# Patient Record
Sex: Male | Born: 1955 | Race: Black or African American | Hispanic: No | Marital: Married | Smoking: Never smoker
Health system: Southern US, Community
[De-identification: ages and names within clinical notes are randomized; demographics above are authoritative.]

## PROBLEM LIST (undated history)

## (undated) DIAGNOSIS — IMO0001 Reserved for inherently not codable concepts without codable children: Secondary | ICD-10-CM

## (undated) HISTORY — DX: Reserved for inherently not codable concepts without codable children: IMO0001

---

## 2007-08-18 ENCOUNTER — Emergency Department (HOSPITAL_COMMUNITY): Admission: EM | Admit: 2007-08-18 | Discharge: 2007-08-18 | Payer: Self-pay | Admitting: Emergency Medicine

## 2008-09-09 ENCOUNTER — Encounter: Admission: RE | Admit: 2008-09-09 | Discharge: 2008-09-09 | Payer: Self-pay | Admitting: Internal Medicine

## 2014-04-06 ENCOUNTER — Ambulatory Visit: Payer: Managed Care, Other (non HMO) | Admitting: Family Medicine

## 2014-04-06 ENCOUNTER — Ambulatory Visit: Payer: Managed Care, Other (non HMO)

## 2014-04-06 VITALS — BP 102/80 | HR 74 | Temp 97.9°F | Resp 16 | Ht 65.5 in | Wt 173.4 lb

## 2014-04-06 DIAGNOSIS — M719 Bursopathy, unspecified: Secondary | ICD-10-CM

## 2014-04-06 DIAGNOSIS — M25512 Pain in left shoulder: Secondary | ICD-10-CM

## 2014-04-06 DIAGNOSIS — M25519 Pain in unspecified shoulder: Secondary | ICD-10-CM

## 2014-04-06 DIAGNOSIS — M542 Cervicalgia: Secondary | ICD-10-CM

## 2014-04-06 DIAGNOSIS — M62838 Other muscle spasm: Secondary | ICD-10-CM

## 2014-04-06 DIAGNOSIS — M67912 Unspecified disorder of synovium and tendon, left shoulder: Secondary | ICD-10-CM

## 2014-04-06 DIAGNOSIS — M6790 Unspecified disorder of synovium and tendon, unspecified site: Secondary | ICD-10-CM

## 2014-04-06 DIAGNOSIS — M5030 Other cervical disc degeneration, unspecified cervical region: Secondary | ICD-10-CM

## 2014-04-06 MED ORDER — MELOXICAM 7.5 MG PO TABS: 7.5000 mg | ORAL_TABLET | Freq: Every day | ORAL | Status: DC

## 2014-04-06 MED ORDER — CYCLOBENZAPRINE HCL 5 MG PO TABS: ORAL_TABLET | ORAL | Status: DC

## 2014-04-06 NOTE — Progress Notes (Addendum)
Subjective:  This chart was scribed for Jonathan Flood, MD by Jonathan Santos, ED Scribe. This patient was seen in room 4 and the patient's care was started 11:50 AM.   Patient ID: Jonathan Santos, male    DOB: February 23, 1956, 58 y.o.   MRN: [id number]  HPI HPI Comments: Jonathan Santos is a 58 y.o. male who presents to [facility] complaining of 6 months of intermittent, unchanged left sided neck pain that radiates to the upper back and left shoulder. Patient states he also has some radiation to the left chest for the past 1 week but states this pain is superficial and he is able to reproduce it with applying pressure. He states the neck and upper back pain is worse with neck rotation. 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 He does not experience this pain while working but states the pain presents after he returns home and goes to sleep. He has associated mild left hand weakness at night when pain is most severe. He reports taking ibuprofen about once a week when the pain is presents or is most severe. He denies history of HTN, DM, HLD. He denies cardiac history. He denies nausea, vomiting, diaphoresis, palpitations, rashes, SOB.     Patient is from [place] and has been in the [place] for about 14 years. He states his kids are still in [place] and he travels to see them every 2 years.   PCP Sigmund Hazel   There are no active problems to display for this patient.  History reviewed. No pertinent past medical history. History reviewed. No pertinent past surgical history. No Known Allergies Prior to Admission medications   Not on File   History   Social History  . Marital Status: Married    Spouse Name: N/A    Number of Children: N/A  . Years of Education: N/A   Occupational History  . Not on file.   Social History Main Topics  . Smoking status: Never Smoker   . Smokeless tobacco: Not on file  . Alcohol Use: Yes     Comment: once in a  while  . Drug Use: No  . Sexual Activity: Yes   Other Topics Concern  . Not on file   Social History Narrative  . No narrative on file     Review of Systems  Constitutional: Negative for diaphoresis.  Respiratory: Negative for shortness of breath.   Cardiovascular: Positive for chest pain (superficial ). Negative for palpitations.  Gastrointestinal: Negative for nausea and vomiting.  Musculoskeletal: Positive for arthralgias (left shoulder), back pain (upper), myalgias and neck pain.  Skin: Negative for rash.  Neurological: Positive for weakness (left hand). Negative for numbness.       Objective:   Physical Exam  Vitals reviewed. Constitutional: He is oriented to person, place, and time. He appears well-developed and well-nourished.  HENT:  Head: Normocephalic and atraumatic.  Eyes: EOM are normal. Pupils are equal, round, and reactive to light.  Neck: No JVD present. Carotid bruit is not present.  Cervical: Extension lacks approximately 10 degrees. But intact flexion. Minimal decrease to left lateral flexion. Lacks approximately 15 degrees on right rotation. Lacks approximately 15 degrees on left rotation. Spasm over left paraspinal and left trapezius muscle.   Cardiovascular: Normal rate, regular rhythm and normal heart sounds.   No murmur heard. Pulmonary/Chest: Effort normal and breath sounds normal. He has no rales.  Musculoskeletal: He  exhibits no edema.  Left shoulder: Kinta and AC joints non-tender. Internal rotation slightly decreased on left shoulder to approximately L4. Otherwise intact ROM.   Full rotator cuff strength but slight discomfort with empty can and [place] testing.   NVI distally.   Neurological: He is alert and oriented to person, place, and time. He has normal strength.  Reflex Scores:      Tricep reflexes are 2+ on the right side and 2+ on the left side.      Bicep reflexes are 2+ on the right side and 2+ on the left side.      Brachioradialis  reflexes are 2+ on the right side and 2+ on the left side. Equal arm strength bilaterally. Equal grip strengths bilaterally. 2+ biceps, triceps, and brachioradialis reflexes.   Skin: Skin is warm and dry.  Psychiatric: He has a normal mood and affect.      Filed Vitals:   04/06/14 1124  BP: 102/80  Pulse: 74  Temp: 97.9 F (36.6 C)  TempSrc: Oral  Resp: 16  Height: 5' 5.5" (1.664 m)  Weight: 173 lb 6.4 oz (78.654 kg)  SpO2: 99%    [facility] reading (PRIMARY) by  Dr. Neva SeatGreene C-Spine: Diffuse degenerative changes mid to lower cervical spine with decreased space C4-5, greater than C5-6 with anterior spurring.   EKG: there are no previous tracings available for comparison. Sinus bradycardia. No apparent acute findings.      Assessment & Plan:   Jonathan SchwabDickson K Seliga is a 58 y.o. male Neck pain - Plan: DG Cervical Spine Complete, EKG 12-Lead, meloxicam (MOBIC) 7.5 MG tablet  Left shoulder pain - Plan: DG Cervical Spine Complete, EKG 12-Lead, meloxicam (MOBIC) 7.5 MG tablet  Muscle spasms of neck - Plan: cyclobenzaprine (FLEXERIL) 5 MG tablet  Tendinopathy of left rotator cuff  DDD (degenerative disc disease), cervical  Suspected DDD of cervical spine with possible radiculopathy into shoulder and anterior chest wall prior, but also some signs of RTC tendinosis. Able to work without difficulty.  Trial of Mobic, flexeril if needed. Sx care and if not improving in next 2 weeks, consider prednisone or MRi or PT. Sooner if worse.   ER/RTC precautions discussed if chest sx's return. Understanding expressed.   Meds ordered this encounter  Medications  . cyclobenzaprine (FLEXERIL) 5 MG tablet    Sig: 1 pill by mouth up to every 8 hours as needed. Start with one pill by mouth each bedtime as needed due to sedation    Dispense:  15 tablet    Refill:  0  . meloxicam (MOBIC) 7.5 MG tablet    Sig: Take 1 tablet (7.5 mg total) by mouth daily.    Dispense:  30 tablet    Refill:  0   Patient  Instructions  You do have arthritis in the neck, which can lead to some muscle spasm as well, or a pinched nerve into the shoulder and front of chest at times. You may also have tome tendon irritation in your rotator cuff (muscles of shoulder).  Try the mobic each morning (do not combine with other over the counter pain relievers), flexeril at night if needed.  Heat or ice to area as needed and the other treatments and exercises in the neck care manual as tolerated. If not improving in next few weeks, may need prednisone, a trial of physical therapy or other imaging such as an MRI. Return to the clinic or go to the nearest emergency room if any of your  symptoms worsen or new symptoms occur.  If chest pain returns, or especially if not associated with neck pain - be seen here or in the emergency room.   Rotator Cuff Tendinitis  Rotator cuff tendinitis is inflammation of the tough, cord-like bands that connect muscle to bone (tendons) in your rotator cuff. Your rotator cuff is the collection of all the muscles and tendons that connect your arm to your shoulder. Your rotator cuff holds the head of your upper arm bone (humerus) in the cup (fossa) of your shoulder blade (scapula). CAUSES Rotator cuff tendinitis is usually caused by overusing the joint involved.  SIGNS AND SYMPTOMS  Deep ache in the shoulder also felt on the outside upper arm over the shoulder muscle.  Point tenderness over the area that is injured.  Pain comes on gradually and becomes worse with lifting the arm to the side (abduction) or turning it inward (internal rotation).  May lead to a chronic tear: When a rotator cuff tendon becomes inflamed, it runs the risk of losing its blood supply, causing some tendon fibers to die. This increases the risk that the tendon can fray and partially or completely tear. DIAGNOSIS Rotator cuff tendinitis is diagnosed by taking a medical history, performing a physical exam, and reviewing results of  imaging exams. The medical history is useful to help determine the type of rotator cuff injury. The physical exam will include looking at the injured shoulder, feeling the injured area, and watching you do range-of-motion exercises. X-ray exams are typically done to rule out other causes of shoulder pain, such as fractures. MRI is the imaging exam usually used for significant shoulder injuries. Sometimes a dye study called CT arthrogram is done, but it is not as widely used as MRI. In some institutions, special ultrasound tests may also be used to aid in the diagnosis. TREATMENT  Less Severe Cases  Use of a sling to rest the shoulder for a short period of time. Prolonged use of the sling can cause stiffness, weakness, and loss of motion of the shoulder joint.  Anti-inflammatory medicines, such as ibuprofen or naproxen sodium, may be prescribed. More Severe Cases  Physical therapy.  Use of steroid injections into the shoulder joint.  Surgery. HOME CARE INSTRUCTIONS   Use a sling or splint until the pain decreases. Prolonged use of the sling can cause stiffness, weakness, and loss of motion of the shoulder joint.  Apply ice to the injured area:  Put ice in a plastic bag.  Place a towel between your skin and the bag.  Leave the ice on for 20 minutes, 2 3 times a day.  Try to avoid use other than gentle range of motion while your shoulder is painful. Use the shoulder and exercise only as directed by your health care provider. Stop exercises or range of motion if pain or discomfort increases, unless directed otherwise by your health care provider.  Only take over-the-counter or prescription medicines for pain, discomfort, or fever as directed by your health care provider.  If you were given a shoulder sling and straps (immobilizer), do not remove it except as directed, or until you see a health care provider for a follow-up exam. If you need to remove it, move your arm as little as possible  or as directed.  You may want to sleep on several pillows at night to lessen swelling and pain. SEEK IMMEDIATE MEDICAL CARE IF:   Your shoulder pain increases or new pain develops in your arm, hand,  or fingers and is not relieved with medicines.  You have new, unexplained symptoms, especially increased numbness in the hands or loss of strength.  You develop any worsening of the problems that brought you in for care.  Your arm, hand, or fingers are numb or tingling.  Your arm, hand, or fingers are swollen, painful, or turn white or blue. MAKE SURE YOU:  Understand these instructions.  Will watch your condition.  Will get help right away if you are not doing well or get worse. Document Released: 03/08/2004 Document Revised: 10/07/2013 Document Reviewed: 07/29/2013 [facility] Patient Information 2014 [place], [place].     I personally performed the services described in this documentation, which was scribed in my presence. The recorded information has been reviewed and considered, and addended by me as needed.

## 2014-04-06 NOTE — Patient Instructions (Addendum)
You do have arthritis in the neck, which can lead to some muscle spasm as well, or a pinched nerve into the shoulder and front of chest at times. You may also have tome tendon irritation in your rotator cuff (muscles of shoulder).  Try the mobic each morning (do not combine with other over the counter pain relievers), flexeril at night if needed.  Heat or ice to area as needed and the other treatments and exercises in the neck care manual as tolerated. If not improving in next few weeks, may need prednisone, a trial of physical therapy or other imaging such as an MRI. Return to the clinic or go to the nearest emergency room if any of your symptoms worsen or new symptoms occur.  If chest pain returns, or especially if not associated with neck pain - be seen here or in the emergency room.   Rotator Cuff Tendinitis  Rotator cuff tendinitis is inflammation of the tough, cord-like bands that connect muscle to bone (tendons) in your rotator cuff. Your rotator cuff is the collection of all the muscles and tendons that connect your arm to your shoulder. Your rotator cuff holds the head of your upper arm bone (humerus) in the cup (fossa) of your shoulder blade (scapula). CAUSES Rotator cuff tendinitis is usually caused by overusing the joint involved.  SIGNS AND SYMPTOMS  Deep ache in the shoulder also felt on the outside upper arm over the shoulder muscle.  Point tenderness over the area that is injured.  Pain comes on gradually and becomes worse with lifting the arm to the side (abduction) or turning it inward (internal rotation).  May lead to a chronic tear: When a rotator cuff tendon becomes inflamed, it runs the risk of losing its blood supply, causing some tendon fibers to die. This increases the risk that the tendon can fray and partially or completely tear. DIAGNOSIS Rotator cuff tendinitis is diagnosed by taking a medical history, performing a physical exam, and reviewing results of imaging exams.  The medical history is useful to help determine the type of rotator cuff injury. The physical exam will include looking at the injured shoulder, feeling the injured area, and watching you do range-of-motion exercises. X-ray exams are typically done to rule out other causes of shoulder pain, such as fractures. MRI is the imaging exam usually used for significant shoulder injuries. Sometimes a dye study called CT arthrogram is done, but it is not as widely used as MRI. In some institutions, special ultrasound tests may also be used to aid in the diagnosis. TREATMENT  Less Severe Cases  Use of a sling to rest the shoulder for a short period of time. Prolonged use of the sling can cause stiffness, weakness, and loss of motion of the shoulder joint.  Anti-inflammatory medicines, such as ibuprofen or naproxen sodium, may be prescribed. More Severe Cases  Physical therapy.  Use of steroid injections into the shoulder joint.  Surgery. HOME CARE INSTRUCTIONS   Use a sling or splint until the pain decreases. Prolonged use of the sling can cause stiffness, weakness, and loss of motion of the shoulder joint.  Apply ice to the injured area:  Put ice in a plastic bag.  Place a towel between your skin and the bag.  Leave the ice on for 20 minutes, 2 3 times a day.  Try to avoid use other than gentle range of motion while your shoulder is painful. Use the shoulder and exercise only as directed by your health  care provider. Stop exercises or range of motion if pain or discomfort increases, unless directed otherwise by your health care provider.  Only take over-the-counter or prescription medicines for pain, discomfort, or fever as directed by your health care provider.  If you were given a shoulder sling and straps (immobilizer), do not remove it except as directed, or until you see a health care provider for a follow-up exam. If you need to remove it, move your arm as little as possible or as  directed.  You may want to sleep on several pillows at night to lessen swelling and pain. SEEK IMMEDIATE MEDICAL CARE IF:   Your shoulder pain increases or new pain develops in your arm, hand, or fingers and is not relieved with medicines.  You have new, unexplained symptoms, especially increased numbness in the hands or loss of strength.  You develop any worsening of the problems that brought you in for care.  Your arm, hand, or fingers are numb or tingling.  Your arm, hand, or fingers are swollen, painful, or turn white or blue. MAKE SURE YOU:  Understand these instructions.  Will watch your condition.  Will get help right away if you are not doing well or get worse. Document Released: 03/08/2004 Document Revised: 10/07/2013 Document Reviewed: 07/29/2013 [facility] Patient Information 2014 [place], [place].

## 2015-05-24 ENCOUNTER — Other Ambulatory Visit: Payer: Self-pay | Admitting: Family Medicine

## 2015-05-24 DIAGNOSIS — R51 Headache: Principal | ICD-10-CM

## 2015-05-24 DIAGNOSIS — R519 Headache, unspecified: Secondary | ICD-10-CM

## 2015-06-04 ENCOUNTER — Ambulatory Visit
Admission: RE | Admit: 2015-06-04 | Discharge: 2015-06-04 | Disposition: A | Discharge status: Home / Self Care | Payer: Managed Care, Other (non HMO) | Source: Ambulatory Visit | Attending: Family Medicine | Admitting: Family Medicine

## 2015-06-04 DIAGNOSIS — R519 Headache, unspecified: Secondary | ICD-10-CM

## 2015-06-04 DIAGNOSIS — R51 Headache: Principal | ICD-10-CM

## 2015-06-04 MED ORDER — GADOBENATE DIMEGLUMINE 529 MG/ML IV SOLN: 15.0000 mL | Freq: Once | INTRAVENOUS | Status: AC | PRN

## 2015-06-07 ENCOUNTER — Ambulatory Visit (INDEPENDENT_AMBULATORY_CARE_PROVIDER_SITE_OTHER): Payer: Managed Care, Other (non HMO) | Admitting: Diagnostic Neuroimaging

## 2015-06-07 ENCOUNTER — Encounter: Payer: Self-pay | Admitting: Diagnostic Neuroimaging

## 2015-06-07 VITALS — BP 119/72 | HR 64 | Ht 65.5 in | Wt 167.0 lb

## 2015-06-07 DIAGNOSIS — G44209 Tension-type headache, unspecified, not intractable: Secondary | ICD-10-CM | POA: Diagnosis not present

## 2015-06-07 NOTE — Progress Notes (Signed)
[facility]  PATIENT: Jonathan Santos DOB: 11-Mar-1956  REFERRING CLINICIAN: Antonietta Barcelona, PA HISTORY FROM: patient  REASON FOR VISIT: new consult    HISTORICAL  CHIEF COMPLAINT:  Chief Complaint  Patient presents with  . New Evaluation    chronic headaches; occuring for almost 2 months, wakes him up in the night with ringing in the ears     HISTORY OF PRESENT ILLNESS:   59 year old right-handed male here for evaluation of headaches. For past 2 months patient has had intermittent headaches, mainly in the back of the head, sometimes rating to the front, sometimes associated with an echo sound which wakes him up around 1 or 2 in the morning. At that time headache and be 10 out of 10 in severity. No nausea or vomiting. No photophobia or phonophobia. Currently patient has 4 out of 10 severity headache.  February 2016 patient went from 8 hour work days up to 12 hour work days, also with change in sleep schedule. Now he has to wake up at 5:00 in the morning. Previously was working second shift. Patient thinks that increased work schedule and stress have caused these headaches. Patient is planning to go back to 8 hour work days in July 2016.  No other numbness, tingling, slurred speech or trouble talking. Patient's daughter had headaches when she was younger.  REVIEW OF SYSTEMS: Full 14 system review of systems performed and notable only for chest pain ringing in ears joint pain headache disinterest in activities.  ALLERGIES: No Known Allergies  HOME MEDICATIONS: Outpatient Prescriptions Prior to Visit  Medication Sig Dispense Refill  . cyclobenzaprine (FLEXERIL) 5 MG tablet 1 pill by mouth up to every 8 hours as needed. Start with one pill by mouth each bedtime as needed due to sedation 15 tablet 0  . meloxicam (MOBIC) 7.5 MG tablet Take 1 tablet (7.5 mg total) by mouth daily. 30 tablet 0   No facility-administered medications prior to visit.    PAST MEDICAL  HISTORY: Past Medical History  Diagnosis Date  . Healthy adult     PAST SURGICAL HISTORY: No past surgical history on file.  FAMILY HISTORY: No family history on file.  SOCIAL HISTORY:  History   Social History  . Marital Status: Married    Spouse Name: Jasmine December  . Number of Children: 4  . Years of Education: 12   Occupational History  . Not on file.   Social History Main Topics  . Smoking status: Never Smoker   . Smokeless tobacco: Not on file  . Alcohol Use: Yes     Comment: once in a while  . Drug Use: No  . Sexual Activity: Yes   Other Topics Concern  . Not on file   Social History Narrative   Lives at home with wife   Does not drink caffeine     PHYSICAL EXAM  Filed Vitals:   06/07/15 0843  BP: 119/72  Pulse: 64  Height: 5' 5.5" (1.664 m)  Weight: 167 lb (75.751 kg)    Body mass index is 27.36 kg/(m^2).   Visual Acuity Screening   Right eye Left eye Both eyes  Without correction: 20/40 20/50   With correction:       No flowsheet data found.  GENERAL EXAM: Patient is in no distress; well developed, nourished and groomed; neck is supple; SCLERAL INJECTION  CARDIOVASCULAR: Regular rate and rhythm, no murmurs, no carotid bruits  NEUROLOGIC: MENTAL STATUS: awake, alert, oriented to person, place and  time, recent and remote memory intact, normal attention and concentration, language fluent, comprehension intact, naming intact, fund of knowledge appropriate CRANIAL NERVE: no papilledema on fundoscopic exam, pupils equal and reactive to light, visual fields full to confrontation, extraocular muscles intact, no nystagmus, facial sensation and strength symmetric, hearing intact, palate elevates symmetrically, uvula midline, shoulder shrug symmetric, tongue midline. MOTOR: normal bulk and tone, full strength in the BUE, BLE SENSORY: normal and symmetric to light touch, pinprick, temperature, vibration  COORDINATION: finger-nose-finger, fine finger  movements normal REFLEXES: deep tendon reflexes present and symmetric GAIT/STATION: narrow based gait; able to walk tandem; romberg is negative    DIAGNOSTIC DATA (LABS, IMAGING, TESTING) - I reviewed patient records, labs, notes, testing and imaging myself where available.  No results found for: WBC, HGB, HCT, MCV, PLT No results found for: NA, K, CL, CO2, GLUCOSE, BUN, CREATININE, CALCIUM, PROT, ALBUMIN, AST, ALT, ALKPHOS, BILITOT, GFRNONAA, GFRAA No results found for: CHOL, HDL, LDLCALC, LDLDIRECT, TRIG, CHOLHDL No results found for: PIRJ1OHGBA1C No results found for: VITAMINB12 No results found for: TSH   06/04/15 MRI BRAIN - Nonspecific left frontal calvarial 5 mm enhancing structure. Otherwise no intracranial mass or abnormal enhancement is noted. No acute infarct, intracranial hemorrhage or hydrocephalus. [I reviewed images myself and agree with interpretation. -VRP]     ASSESSMENT AND PLAN  59 y.o. year old male here with NEW ONSET HEADACHES since April 2016, in setting of changed work schedule (8hr --> 12 hrs per day; earlier shift and wake up time; less time off). Neuro exam, MRI brain, labs unremarkable. Likely represents tension headache due to change in sleep/work schedule.  PLAN: - monitor symptoms; pt plans to go back to 8 hr days in July 2016 - tylenol, ibuprofen prn - rest, hydration, nutrition reviewed - if HA continue or worsen, may consider add'l testing at next visit (such as LP, labs, CTA head)  Return in about 6 weeks (around 07/19/2015).    Suanne MarkerVIKRAM R. Samaiya Awadallah, MD 06/07/2015, 9:40 AM Certified in Neurology, Neurophysiology and Neuroimaging  [facility] [address], [address] [place], [place] [address] [phone]

## 2015-06-07 NOTE — Patient Instructions (Signed)
Monitor symptoms. 

## 2015-06-08 ENCOUNTER — Encounter: Payer: Self-pay | Admitting: Diagnostic Neuroimaging

## 2015-07-20 ENCOUNTER — Encounter: Payer: Self-pay | Admitting: Diagnostic Neuroimaging

## 2015-07-20 ENCOUNTER — Ambulatory Visit (INDEPENDENT_AMBULATORY_CARE_PROVIDER_SITE_OTHER): Payer: Managed Care, Other (non HMO) | Admitting: Diagnostic Neuroimaging

## 2015-07-20 VITALS — BP 108/68 | HR 70 | Ht 65.5 in | Wt 168.8 lb

## 2015-07-20 DIAGNOSIS — R0683 Snoring: Secondary | ICD-10-CM

## 2015-07-20 DIAGNOSIS — G4719 Other hypersomnia: Secondary | ICD-10-CM | POA: Diagnosis not present

## 2015-07-20 DIAGNOSIS — G44209 Tension-type headache, unspecified, not intractable: Secondary | ICD-10-CM

## 2015-07-20 NOTE — Progress Notes (Signed)
[facility]  PATIENT: Jonathan Santos DOB: 1956-11-07  REFERRING CLINICIAN: Antonietta Barcelona, PA HISTORY FROM: patient  REASON FOR VISIT: follow up   HISTORICAL  CHIEF COMPLAINT:  Chief Complaint  Patient presents with  . Tension HA    rm 6, echo sound in my head"  . Follow-up    HISTORY OF PRESENT ILLNESS:   UPDATE 07/20/15: Since last visit, HA are slightly better. Still having early morning "echo" sounds in both ears. Reports history of snoring. Also has tendency to easily fall asleep in the daytime and nap.  PRIOR HPI (06/07/15): 59 year old right-handed male here for evaluation of headaches. For past 2 months patient has had intermittent headaches, mainly in the back of the head, sometimes rating to the front, sometimes associated with an echo sound which wakes him up around 1 or 2 in the morning. At that time headache and be 10 out of 10 in severity. No nausea or vomiting. No photophobia or phonophobia. Currently patient has 4 out of 10 severity headache. February 2016 patient went from 8 hour work days up to 12 hour work days, also with change in sleep schedule. Now he has to wake up at 5:00 in the morning. Previously was working second shift. Patient thinks that increased work schedule and stress have caused these headaches. Patient is planning to go back to 8 hour work days in July 2016. No other numbness, tingling, slurred speech or trouble talking. Patient's daughter had headaches when she was younger.   REVIEW OF SYSTEMS: Full 14 system review of systems performed and notable only for as per HPI.   ALLERGIES: No Known Allergies  HOME MEDICATIONS: Outpatient Prescriptions Prior to Visit  Medication Sig Dispense Refill  . acetaminophen (TYLENOL) 325 MG tablet Take 650 mg by mouth every 6 (six) hours as needed.    Marland Kitchen ibuprofen (ADVIL,MOTRIN) 200 MG tablet Take 200 mg by mouth every 6 (six) hours as needed.     No facility-administered medications prior to  visit.    PAST MEDICAL HISTORY: Past Medical History  Diagnosis Date  . Healthy adult     PAST SURGICAL HISTORY: No past surgical history on file.  FAMILY HISTORY: No family history on file.  SOCIAL HISTORY:  History   Social History  . Marital Status: Married    Spouse Name: Jasmine December  . Number of Children: 4  . Years of Education: 12   Occupational History  . Not on file.   Social History Main Topics  . Smoking status: Never Smoker   . Smokeless tobacco: Not on file  . Alcohol Use: Yes     Comment: once in a while  . Drug Use: No  . Sexual Activity: Yes   Other Topics Concern  . Not on file   Social History Narrative   Lives at home with wife   Does not drink caffeine     PHYSICAL EXAM  Filed Vitals:   07/20/15 1054  BP: 108/68  Pulse: 70  Height: 5' 5.5" (1.664 m)  Weight: 168 lb 12.8 oz (76.567 kg)    Body mass index is 27.65 kg/(m^2).  No exam data present  No flowsheet data found.  GENERAL EXAM: Patient is in no distress; well developed, nourished and groomed; neck is supple; SCLERAL INJECTION  CARDIOVASCULAR: Regular rate and rhythm, no murmurs, no carotid bruits  NEUROLOGIC: MENTAL STATUS: awake, alert, language fluent, comprehension intact, naming intact, fund of knowledge appropriate CRANIAL NERVE: pupils equal and reactive to light,  visual fields full to confrontation, extraocular muscles intact, no nystagmus, facial sensation and strength symmetric, hearing intact, palate elevates symmetrically, uvula midline, shoulder shrug symmetric, tongue midline. MOTOR: normal bulk and tone, full strength in the BUE, BLE SENSORY: normal and symmetric to light touch, temperature, vibration  COORDINATION: finger-nose-finger, fine finger movements normal REFLEXES: deep tendon reflexes present and symmetric GAIT/STATION: narrow based gait; able to walk tandem; romberg is negative    DIAGNOSTIC DATA (LABS, IMAGING, TESTING) - I reviewed patient  records, labs, notes, testing and imaging myself where available.  No results found for: WBC, HGB, HCT, MCV, PLT No results found for: NA, K, CL, CO2, GLUCOSE, BUN, CREATININE, CALCIUM, PROT, ALBUMIN, AST, ALT, ALKPHOS, BILITOT, GFRNONAA, GFRAA No results found for: CHOL, HDL, LDLCALC, LDLDIRECT, TRIG, CHOLHDL No results found for: BJYN8GHGBA1C No results found for: VITAMINB12 No results found for: TSH   06/04/15 MRI BRAIN - Nonspecific left frontal calvarial 5 mm enhancing structure. Otherwise no intracranial mass or abnormal enhancement is noted. No acute infarct, intracranial hemorrhage or hydrocephalus. [I reviewed images myself and agree with interpretation. -VRP]     ASSESSMENT AND PLAN  59 y.o. year old male here with NEW ONSET HEADACHES since April 2016, in setting of changed work schedule (8hr --> 12 hrs per day; earlier shift and wake up time; less time off). Neuro exam, MRI brain, labs unremarkable. Likely represents tension headache due to change in sleep/work schedule. HA slightly better, and now back to 8 hour work days. Also has snoring and excessive daytime sleepiness. Will check sleep study to rule out sleep apnea.  PLAN: I spent 15 minutes of face to face time with patient. Greater than 50% of time was spent in counseling and coordination of care with patient. In summary we discussed:  - check sleep study (HA, daytime sleepiness, snoring) - monitor symptoms - tylenol, ibuprofen prn - rest, hydration, nutrition reviewed - if HA continue or worsen, may consider add'l testing at next visit (such as LP, labs, CTA head)  Return in about 6 months (around 01/20/2016).    Suanne MarkerVIKRAM R. Lc Joynt, MD 07/20/2015, 11:22 AM Certified in Neurology, Neurophysiology and Neuroimaging  [facility] [address], [address] [place], [place] [id number] [phone]

## 2015-08-29 ENCOUNTER — Telehealth: Payer: Self-pay

## 2015-08-29 ENCOUNTER — Institutional Professional Consult (permissible substitution): Payer: Managed Care, Other (non HMO) | Admitting: Neurology

## 2015-08-29 NOTE — Telephone Encounter (Signed)
Patient did not show to appt.  

## 2015-08-31 ENCOUNTER — Encounter: Payer: Self-pay | Admitting: Neurology

## 2015-10-05 ENCOUNTER — Ambulatory Visit (INDEPENDENT_AMBULATORY_CARE_PROVIDER_SITE_OTHER): Payer: Managed Care, Other (non HMO) | Admitting: Neurology

## 2015-10-05 ENCOUNTER — Encounter: Payer: Self-pay | Admitting: Neurology

## 2015-10-05 VITALS — BP 108/68 | HR 62 | Resp 16 | Ht 65.5 in | Wt 170.0 lb

## 2015-10-05 DIAGNOSIS — R0683 Snoring: Secondary | ICD-10-CM

## 2015-10-05 DIAGNOSIS — G4719 Other hypersomnia: Secondary | ICD-10-CM

## 2015-10-05 DIAGNOSIS — R51 Headache: Secondary | ICD-10-CM | POA: Diagnosis not present

## 2015-10-05 DIAGNOSIS — R519 Headache, unspecified: Secondary | ICD-10-CM

## 2015-10-05 DIAGNOSIS — G478 Other sleep disorders: Secondary | ICD-10-CM

## 2015-10-05 DIAGNOSIS — R351 Nocturia: Secondary | ICD-10-CM

## 2015-10-05 NOTE — Patient Instructions (Addendum)
Based on your symptoms and your exam I believe you may be at risk for obstructive sleep apnea or OSA, and I think we should proceed with a sleep study to determine whether you do or do not have OSA and how severe it is. If you have more than mild OSA, I want you to consider treatment with CPAP. Please remember, the risks and ramifications of moderate to severe obstructive sleep apnea or OSA are: Cardiovascular disease, including congestive heart failure, stroke, difficult to control hypertension, arrhythmias, and even type 2 diabetes has been linked to untreated OSA. Sleep apnea causes disruption of sleep and sleep deprivation in most cases, which, in turn, can cause recurrent headaches, problems with memory, mood, concentration, focus, and vigilance. Most people with untreated sleep apnea report excessive daytime sleepiness, which can affect their ability to drive. Please do not drive if you feel sleepy.   I will likely see you back after your sleep study to go over the test results and where to go from there. We will call you after your sleep study to advise about the results (most likely, you will hear from Lafonda Mosses, my nurse) and to set up an appointment at the time, as necessary.    Our sleep lab administrative assistant, Alvis Lemmings will meet with you or call you to schedule your sleep study. If you don't hear back from her by next week please feel free to call her at [phone]. This is her direct line and please leave a message with your phone number to call back if you get the voicemail box. She will call back as soon as possible.   Due to your work schedule, we will try to find a Saturday night for your sleep study.

## 2015-10-05 NOTE — Progress Notes (Signed)
Subjective:    Patient ID: Jonathan Santos is a 59 y.o. male.  HPI     Huston Foley, MD, PhD [facility] [address], [address] [address] [place], [place] [address]  Dear Satira Sark,    I saw your patient, Jonathan Santos, upon your kind request in my clinic today for initial consultation of his sleep disorder, in particular, concern for underlying obstructive sleep apnea. The patient is unaccompanied today. Of note, the patient no showed for an appointment on 08/29/2015. As you know, Jonathan Santos is a 59 year old right-handed gentleman with an underlying medical history of recurrent tension-type headaches, and overweight state, who reports snoring and excessive daytime somnolence. He feels tired during the day. He does not wake up rested typically. His wife is not noticed any breathing pauses while he is asleep. He lives with his wife. He has 3 grown children, all in [place]. He has a variable bedtime because of his work schedule. 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 He has never fallen asleep at work or while driving.  His work schedule is 3 PM to 11:30 PM , and on Mondays and Wednesdays he works from 3 PM to 3 AM. Bedtime is usually around 12:30 AM and rise time is 9 AM, when he comes home on early Tuesdays and Thursdays he goes to bed around 4:30 AM. He sometimes wakes up with a headache and a pulsating echo like sound in his head. He has taken Advil as needed. He takes it about once a week. He drinks no significant caffeine and very rare all call. She does not smoke. He tries to drink a lot of water. He does have nocturia about 2-3 times on an average night because he drinks a lot of water right before bedtime. He has not noted any edema. He denies any restless leg symptoms or leg twitching at night but is a restless sleeper and likes to hug a pillow while sleeping.  I reviewed your office note from 07/20/2015.  His Past Medical History  Is Significant For: Past Medical History  Diagnosis Date  . Healthy adult     His Past Surgical History Is Significant For: No past surgical history on file.  His Family History Is Significant For: No family history on file.  Her Social History Is Significant For: Social History   Social History  . Marital Status: Married    Spouse Name: Jasmine December  . Number of Children: 3  . Years of Education: 15   Social History Main Topics  . Smoking status: Never Smoker   . Smokeless tobacco: None  . Alcohol Use: Yes     Comment: once in a while  . Drug Use: No  . Sexual Activity: Yes   Other Topics Concern  . None   Social History Narrative   Lives at home with wife   Does not drink caffeine    His Allergies Are:  No Known Allergies:   His Current Medications Are:  Outpatient Encounter Prescriptions as of 10/05/2015  Medication Sig  . ibuprofen (ADVIL,MOTRIN) 200 MG tablet Take 200 mg by mouth every 6 (six) hours as needed.  Marland Kitchen acetaminophen (TYLENOL) 325 MG tablet Take 650 mg by mouth every 6 (six) hours as needed.   No facility-administered encounter medications on file as of 10/05/2015.  :  Review of Systems:  Out of a complete 14 point review of systems, all are reviewed and negative with the exception of  these symptoms as listed below:  Review of Systems  Neurological: Positive for headaches.       Patient wakes up about 3-4 am, hearing a "noise" in the back of his head accompanied with a headache. He states that wife reports some snoring.     Objective:  Neurologic Exam  Physical Exam Physical Examination:   Filed Vitals:   10/05/15 0924  BP: 108/68  Pulse: 62  Resp: 16    General Examination: The patient is a very pleasant 59 y.o. male in no acute distress. He appears well-developed and well-nourished and adequately groomed. He is not obese.   HEENT: Normocephalic, atraumatic, pupils are equal, round and reactive to light and accommodation. Funduscopic exam  is normal with sharp disc margins noted. Extraocular tracking is good without limitation to gaze excursion or nystagmus noted. Normal smooth pursuit is noted. Hearing is grossly intact. Tympanic membranes are clear bilaterally. Face is symmetric with normal facial animation and normal facial sensation. Speech is clear with no dysarthria noted. There is no hypophonia. There is no lip, neck/head, jaw or voice tremor. Neck is supple with full range of passive and active motion. There are no carotid bruits on auscultation. Oropharynx exam reveals: mild mouth dryness, good dental hygiene and moderate airway crowding, due to narrow airway entry, longer tongue, tonsils in place and larger uvula, which ends in a wisp. Mallampati is class II. Tongue protrudes centrally and palate elevates symmetrically. Tonsils are 1+ in size, right more prominent. Neck size is 15 inches. He has a Mild overbite. Nasal inspection reveals no significant nasal mucosal bogginess or redness and no septal deviation.   Chest: Clear to auscultation without wheezing, rhonchi or crackles noted.  Heart: S1+S2+0, regular and normal without murmurs, rubs or gallops noted.   Abdomen: Soft, non-tender and non-distended with normal bowel sounds appreciated on auscultation.  Extremities: There is no pitting edema in the distal lower extremities bilaterally. Pedal pulses are intact.  Skin: Warm and dry without trophic changes noted. There are no varicose veins.  Musculoskeletal: exam reveals no obvious joint deformities, tenderness or joint swelling or erythema.   Neurologically:  Mental status: The patient is awake, alert and oriented in all 4 spheres. His immediate and remote memory, attention, language skills and fund of knowledge are appropriate. There is no evidence of aphasia, agnosia, apraxia or anomia. Speech is clear with normal prosody and enunciation. Thought process is linear. Mood is normal and affect is normal.  Cranial nerves II  - XII are as described above under HEENT exam. In addition: shoulder shrug is normal with equal shoulder height noted. Motor exam: Normal bulk, strength and tone is noted. There is no drift, tremor or rebound. Romberg is negative. Reflexes are 2+ throughout. Babinski: Toes are flexor bilaterally. Fine motor skills and coordination: intact with normal finger taps, normal hand movements, normal rapid alternating patting, normal foot taps and normal foot agility.  Cerebellar testing: No dysmetria or intention tremor on finger to nose testing. Heel to shin is unremarkable bilaterally. There is no truncal or gait ataxia.  Sensory exam: intact to light touch in the upper and lower extremities.  Gait, station and balance: He stands easily. No veering to one side is noted. No leaning to one side is noted. Posture is age-appropriate and stance is narrow based. Gait shows normal stride length and normal pace. No problems turning are noted. He turns en bloc. Tandem walk is unremarkable.    Assessment and Plan:   In summary,  Jonathan Santos is a very pleasant 59 y.o.-year old male with a history and physical exam concerning for obstructive sleep apnea (OSA). I had a long chat with the patient about my findings and the diagnosis of OSA, its prognosis and treatment options. We talked about medical treatments, surgical interventions and non-pharmacological approaches. I explained in particular the risks and ramifications of untreated moderate to severe OSA, especially with respect to developing cardiovascular disease down the Road, including congestive heart failure, difficult to treat hypertension, cardiac arrhythmias, or stroke. Even type 2 diabetes has, in part, been linked to untreated OSA. Symptoms of untreated OSA include daytime sleepiness, memory problems, mood irritability and mood disorder such as depression and anxiety, lack of energy, as well as recurrent headaches, especially morning headaches. We talked  about ongoing healthy lifestyle in general, as well as the importance of weight control. I encouraged the patient to eat healthy, exercise daily and keep well hydrated, to keep a scheduled bedtime and wake time routine, to not skip any meals and eat healthy snacks in between meals. I advised the patient not to drive when feeling sleepy. I recommended the following at this time: sleep study with potential positive airway pressure titration. (We will score hypopneas at 3% and split the sleep study into diagnostic and treatment portion, if the estimated. 2 hour AHI is >15/h, or as per insurance mandate).   I explained the sleep test procedure to the patient and also outlined possible surgical and non-surgical treatment options of OSA, including the use of a custom-made dental device (which would require a referral to a specialist dentist or oral surgeon), upper airway surgical options, such as pillar implants, radiofrequency surgery, tongue base surgery, and UPPP (which would involve a referral to an ENT surgeon). Rarely, jaw surgery such as mandibular advancement may be considered.  I also explained the CPAP treatment option to the patient, who indicated that he would be willing to try CPAP if the need arises. I explained the importance of being compliant with PAP treatment, not only for insurance purposes but primarily to improve His symptoms, and for the patient's long term health benefit, including to reduce His cardiovascular risks. I answered all his questions today and the patient was in agreement. I would like to see him back after the sleep study is completed and asked him to keep his appointment with you for ongoing headache management.  Thank you very much for allowing me to participate in the care of this nice patient. If I can be of any further assistance to you please do not hesitate to talk to me.  Sincerely,   Huston Foley, MD, PhD

## 2015-11-11 ENCOUNTER — Ambulatory Visit (INDEPENDENT_AMBULATORY_CARE_PROVIDER_SITE_OTHER): Payer: Managed Care, Other (non HMO) | Admitting: Neurology

## 2015-11-11 DIAGNOSIS — G4720 Circadian rhythm sleep disorder, unspecified type: Secondary | ICD-10-CM

## 2015-11-11 DIAGNOSIS — G4733 Obstructive sleep apnea (adult) (pediatric): Secondary | ICD-10-CM

## 2015-11-12 NOTE — Sleep Study (Signed)
Please see the scanned sleep study interpretation located in the procedure tab within the chart review section.   

## 2015-11-16 ENCOUNTER — Telehealth: Payer: Self-pay | Admitting: Neurology

## 2015-11-16 ENCOUNTER — Telehealth: Payer: Self-pay

## 2015-11-16 NOTE — Telephone Encounter (Signed)
Encounter should be fixed on this patient

## 2015-11-16 NOTE — Telephone Encounter (Signed)
Pt returned Jonathan Santos's call. He is leaving for work and will call back when he gets to work.

## 2015-11-16 NOTE — Telephone Encounter (Signed)
Patient called back but I missed call. I called him back and got vm. I left results and recommendations on vm, left call back number for further questions.

## 2015-11-16 NOTE — Telephone Encounter (Signed)
Left message for patient to call back  

## 2015-11-16 NOTE — Telephone Encounter (Signed)
Patient referred by Dr. Marjory LiesPenumalli, seen by me on 10/05/15, diagnostic PSG on 11/11/15.   Please call and notify the patient that the recent sleep study did not show any significant obstructive sleep apnea. He had mild sleep apnea only when in dream sleep and when sleeping on his back. For this, weight loss and avoidance of the supine sleep position are recommended, treatment with CPAP is not warranted. He can FU with Dr. Marjory LiesPenumalli as planned. Also, route or fax report to PCP and referring MD, if other than PCP.  Once you have spoken to patient, you can close this encounter.   Thanks,  Huston FoleySaima Jonisha Kindig, MD, PhD [facility]

## 2015-12-16 ENCOUNTER — Other Ambulatory Visit: Payer: Self-pay | Admitting: Occupational Medicine

## 2015-12-16 ENCOUNTER — Ambulatory Visit: Payer: Self-pay

## 2015-12-16 DIAGNOSIS — Z0000 Encounter for general adult medical examination without abnormal findings: Secondary | ICD-10-CM

## 2016-01-23 ENCOUNTER — Ambulatory Visit (INDEPENDENT_AMBULATORY_CARE_PROVIDER_SITE_OTHER): Payer: Managed Care, Other (non HMO) | Admitting: Diagnostic Neuroimaging

## 2016-01-23 ENCOUNTER — Encounter: Payer: Self-pay | Admitting: Diagnostic Neuroimaging

## 2016-01-23 VITALS — BP 103/68 | HR 66 | Ht 65.5 in | Wt 167.6 lb

## 2016-01-23 DIAGNOSIS — G44209 Tension-type headache, unspecified, not intractable: Secondary | ICD-10-CM

## 2016-01-23 NOTE — Progress Notes (Signed)
[facility]  PATIENT: Jonathan Santos DOB: [birth year]  REFERRING CLINICIAN: Antonietta Barcelona, PA HISTORY FROM: patient  REASON FOR VISIT: follow up   HISTORICAL  CHIEF COMPLAINT:  Chief Complaint  Patient presents with  . Tension Headache    rm 7, HA not changed dramatically, off and on around 3 am, freq varies a lot, I drink a lot of water-lie back down and HA goes awayringing in my, ears continues"  . Follow-up    6 month    HISTORY OF PRESENT ILLNESS:   UPDATE 01/23/16: Since last visit, HA are stable. Now working 3p-11p, 6 days per week. He feels that his work schedule is quite rigorous, and that he needs more rest.   UPDATE 07/20/15: Since last visit, HA are slightly better. Still having early morning "echo" sounds in both ears. Reports history of snoring. Also has tendency to easily fall asleep in the daytime and nap.  PRIOR HPI (06/07/15): 60 year old right-handed male here for evaluation of headaches. For past 2 months patient has had intermittent headaches, mainly in the back of the head, sometimes rating to the front, sometimes associated with an echo sound which wakes him up around 1 or 2 in the morning. At that time headache and be 10 out of 10 in severity. No nausea or vomiting. No photophobia or phonophobia. Currently patient has 4 out of 10 severity headache. February 2016 patient went from 8 hour work days up to 12 hour work days, also with change in sleep schedule. Now he has to wake up at 5:00 in the morning. Previously was working second shift. Patient thinks that increased work schedule and stress have caused these headaches. Patient is planning to go back to 8 hour work days in July 2016. No other numbness, tingling, slurred speech or trouble talking. Patient's daughter had headaches when she was younger.   REVIEW OF SYSTEMS: Full 14 system review of systems performed and notable only for as per HPI.   ALLERGIES: No Known Allergies  HOME  MEDICATIONS: Outpatient Prescriptions Prior to Visit  Medication Sig Dispense Refill  . acetaminophen (TYLENOL) 325 MG tablet Take 650 mg by mouth every 6 (six) hours as needed.    Marland Kitchen ibuprofen (ADVIL,MOTRIN) 200 MG tablet Take 200 mg by mouth every 6 (six) hours as needed.     No facility-administered medications prior to visit.    PAST MEDICAL HISTORY: Past Medical History  Diagnosis Date  . Healthy adult     PAST SURGICAL HISTORY: History reviewed. No pertinent past surgical history.  FAMILY HISTORY: History reviewed. No pertinent family history.  SOCIAL HISTORY:  Social History   Social History  . Marital Status: Married    Spouse Name: Jasmine December  . Number of Children: 3  . Years of Education: 12   Occupational History  . Not on file.   Social History Main Topics  . Smoking status: Never Smoker   . Smokeless tobacco: Not on file  . Alcohol Use: Yes     Comment: once in a while  . Drug Use: No  . Sexual Activity: Yes   Other Topics Concern  . Not on file   Social History Narrative   Lives at home with wife   Does not drink caffeine     PHYSICAL EXAM  Filed Vitals:   01/23/16 0815  BP: 103/68  Pulse: 66  Height: 5' 5.5" (1.664 m)  Weight: 167 lb 9.6 oz (76.023 kg)    Body mass index is  27.46 kg/(m^2).  No exam data present  No flowsheet data found.  GENERAL EXAM: Patient is in no distress; well developed, nourished and groomed; neck is supple; SCLERAL INJECTION  CARDIOVASCULAR: Regular rate and rhythm, no murmurs, no carotid bruits  NEUROLOGIC: MENTAL STATUS: awake, alert, language fluent, comprehension intact, naming intact, fund of knowledge appropriate CRANIAL NERVE: pupils equal and reactive to light, visual fields full to confrontation, extraocular muscles intact, no nystagmus, facial sensation and strength symmetric, hearing intact, palate elevates symmetrically, uvula midline, shoulder shrug symmetric, tongue midline. MOTOR: normal bulk  and tone, full strength in the BUE, BLE SENSORY: normal and symmetric to light touch, temperature, vibration  COORDINATION: finger-nose-finger, fine finger movements normal REFLEXES: deep tendon reflexes present and symmetric GAIT/STATION: narrow based gait; able to walk tandem; romberg is negative    DIAGNOSTIC DATA (LABS, IMAGING, TESTING) - I reviewed patient records, labs, notes, testing and imaging myself where available.  No results found for: WBC, HGB, HCT, MCV, PLT No results found for: NA, K, CL, CO2, GLUCOSE, BUN, CREATININE, CALCIUM, PROT, ALBUMIN, AST, ALT, ALKPHOS, BILITOT, GFRNONAA, GFRAA No results found for: CHOL, HDL, LDLCALC, LDLDIRECT, TRIG, CHOLHDL No results found for: EAVW0J No results found for: VITAMINB12 No results found for: TSH   06/04/15 MRI BRAIN - Nonspecific left frontal calvarial 5 mm enhancing structure. Otherwise no intracranial mass or abnormal enhancement is noted. No acute infarct, intracranial hemorrhage or hydrocephalus. [I reviewed images myself and agree with interpretation. -VRP]     ASSESSMENT AND PLAN  60 y.o. year old male here with NEW ONSET HEADACHES since April 2016, in setting of changed work schedule (8hr --> 12 hrs per day; earlier shift and wake up time; less time off). Neuro exam, MRI brain, labs unremarkable. Likely represents tension headache due to change in sleep/work schedule. HA slightly better, and now back to 8 hour work days. Also has snoring and excessive daytime sleepiness. Will check sleep study to rule out sleep apnea.  PLAN: - monitor symptoms - tylenol, ibuprofen prn - rest, hydration, nutrition reviewed - advised patient to apply for 5 days / 40 hour work week schedule for better work / life balance  Return if symptoms worsen or fail to improve, for return to PCP.    Suanne Marker, MD 01/23/2016, 8:45 AM Certified in Neurology, Neurophysiology and Neuroimaging  [facility] [address] [place], [place] [address] [phone]

## 2016-01-23 NOTE — Patient Instructions (Signed)

## 2016-09-01 IMAGING — CR DG CHEST 2V
2 series · 2 of 2 positions shown · non-contrast
Comparison: None.

CLINICAL DATA: Physical examination/preventive health maintenance

EXAM:
CHEST  2 VIEW

[view not recorded (1 of 2)]
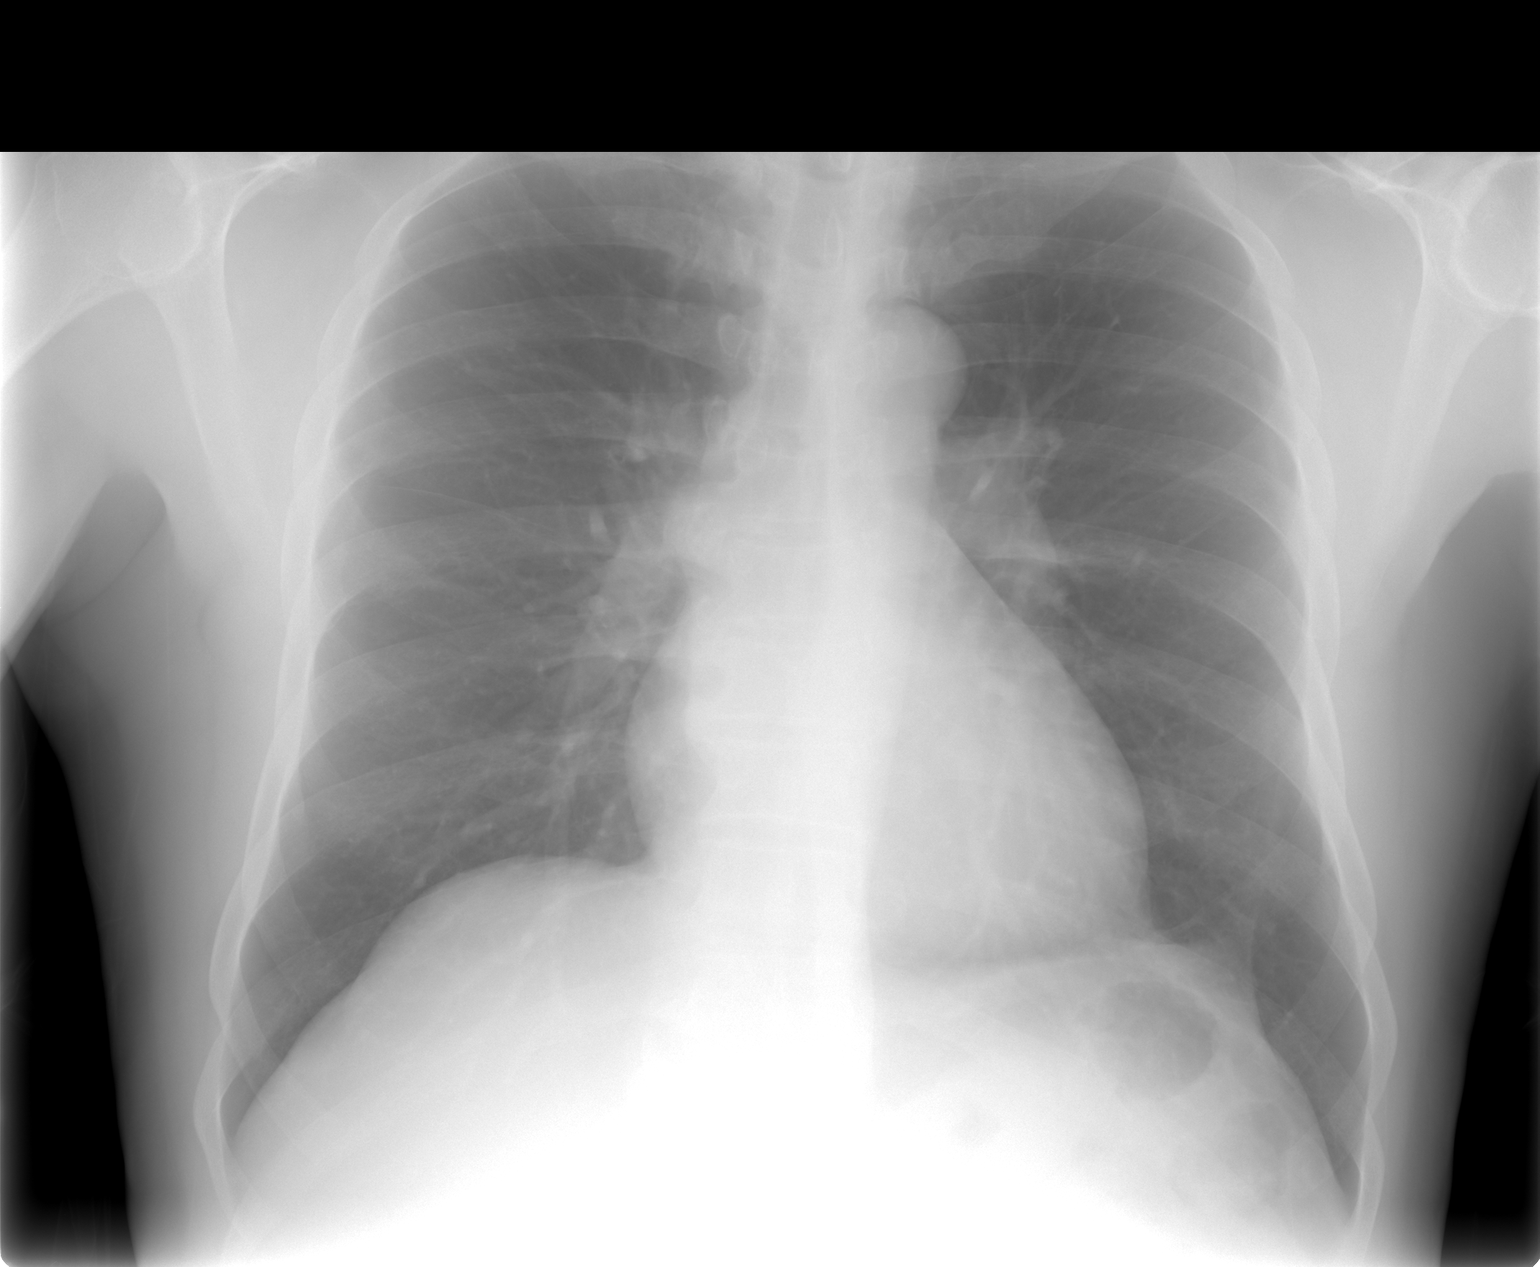

[view not recorded (2 of 2)]
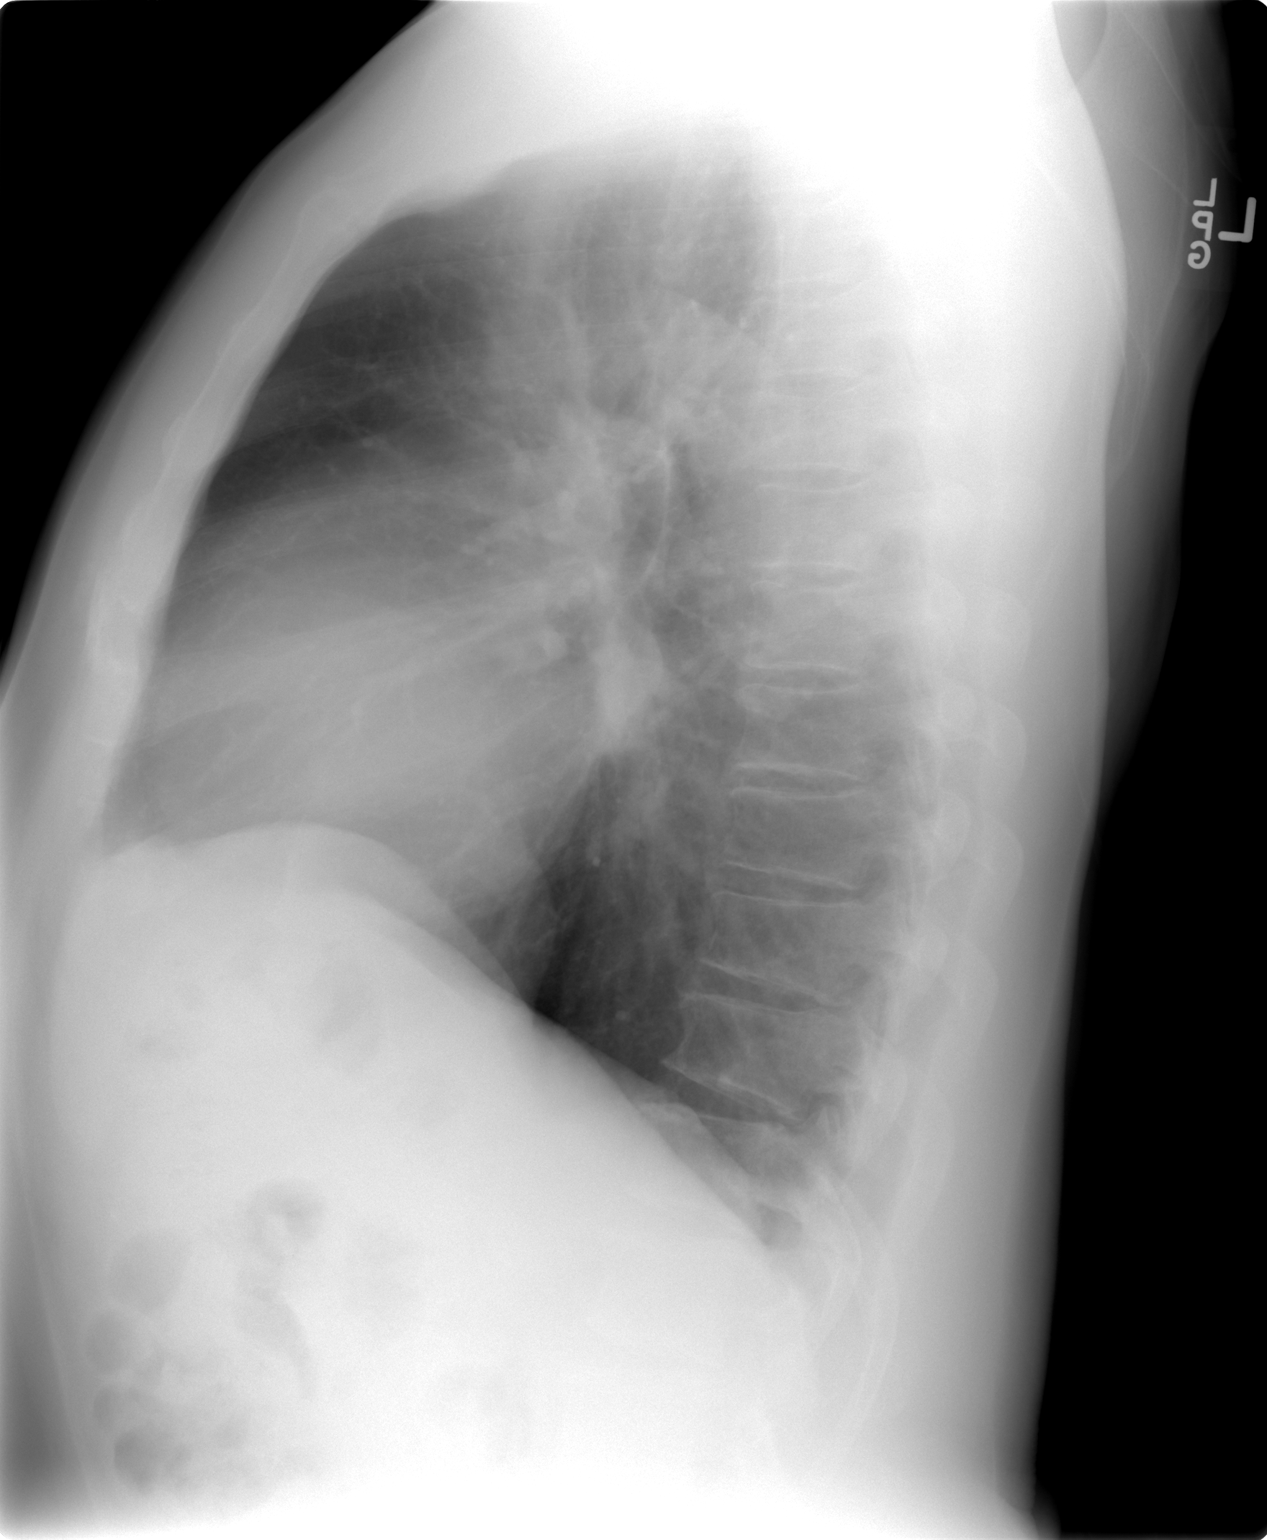

[2 of 2 positions shown; findings below may reference images not displayed]

FINDINGS: There is a minimal focus of scarring in the left base. Lungs
elsewhere clear. Heart size and pulmonary vascularity are normal. No
adenopathy. No bone lesions.
IMPRESSION: Lungs clear except for minimal scarring left base. Cardiac
silhouette within normal limits.

## 2020-10-26 ENCOUNTER — Other Ambulatory Visit: Payer: Self-pay

## 2020-10-26 DIAGNOSIS — Z20822 Contact with and (suspected) exposure to covid-19: Secondary | ICD-10-CM

## 2020-10-27 ENCOUNTER — Telehealth: Payer: Self-pay | Admitting: *Deleted

## 2020-10-27 NOTE — Telephone Encounter (Signed)
Pt called for result of COVID test obtained 10/26/20; explained to pt the time for result is based on the number of tests to be completed; pt informed results are available first in MyChart; pt also informed he will be called regarding his result; the pt was encouraged to answer all calls; he verbalized understanding.

## 2020-10-27 NOTE — Telephone Encounter (Signed)
Pt calling for covid results, active, still pending, pt verbalizes understanding

## 2020-10-28 ENCOUNTER — Telehealth: Payer: Self-pay

## 2020-10-28 LAB — NOVEL CORONAVIRUS, NAA

## 2020-10-28 NOTE — Telephone Encounter (Signed)
Pt. Checking in COVID 19 results, not available yet.
